# Patient Record
Sex: Male | Born: 1975 | Hispanic: Yes | Smoking: Never smoker
Health system: Southern US, Community
[De-identification: ages and names within clinical notes are randomized; demographics above are authoritative.]

## PROBLEM LIST (undated history)

## (undated) DIAGNOSIS — K648 Other hemorrhoids: Secondary | ICD-10-CM

## (undated) DIAGNOSIS — K5790 Diverticulosis of intestine, part unspecified, without perforation or abscess without bleeding: Secondary | ICD-10-CM

## (undated) DIAGNOSIS — K219 Gastro-esophageal reflux disease without esophagitis: Secondary | ICD-10-CM

## (undated) HISTORY — DX: Other hemorrhoids: K64.8

## (undated) HISTORY — DX: Diverticulosis of intestine, part unspecified, without perforation or abscess without bleeding: K57.90

## (undated) HISTORY — DX: Gastro-esophageal reflux disease without esophagitis: K21.9

## (undated) HISTORY — PX: WISDOM TOOTH EXTRACTION: SHX21

---

## 2007-01-08 ENCOUNTER — Emergency Department (HOSPITAL_COMMUNITY): Admission: EM | Admit: 2007-01-08 | Discharge: 2007-01-08 | Payer: Self-pay | Admitting: Emergency Medicine

## 2011-07-24 ENCOUNTER — Emergency Department (HOSPITAL_COMMUNITY)
Admission: EM | Admit: 2011-07-24 | Discharge: 2011-07-24 | Disposition: A | Discharge status: Home / Self Care | Payer: No Typology Code available for payment source | Attending: Emergency Medicine | Admitting: Emergency Medicine

## 2011-07-24 DIAGNOSIS — T1490XA Injury, unspecified, initial encounter: Secondary | ICD-10-CM | POA: Insufficient documentation

## 2011-07-24 DIAGNOSIS — Y92410 Unspecified street and highway as the place of occurrence of the external cause: Secondary | ICD-10-CM | POA: Insufficient documentation

## 2011-07-24 DIAGNOSIS — IMO0002 Reserved for concepts with insufficient information to code with codable children: Secondary | ICD-10-CM | POA: Insufficient documentation

## 2011-07-25 ENCOUNTER — Emergency Department (HOSPITAL_COMMUNITY)
Admission: EM | Admit: 2011-07-25 | Discharge: 2011-07-26 | Disposition: A | Discharge status: Home / Self Care | Payer: No Typology Code available for payment source | Attending: Emergency Medicine | Admitting: Emergency Medicine

## 2011-07-25 DIAGNOSIS — M542 Cervicalgia: Secondary | ICD-10-CM | POA: Insufficient documentation

## 2011-07-25 DIAGNOSIS — Y92410 Unspecified street and highway as the place of occurrence of the external cause: Secondary | ICD-10-CM | POA: Insufficient documentation

## 2011-07-25 DIAGNOSIS — S93409A Sprain of unspecified ligament of unspecified ankle, initial encounter: Secondary | ICD-10-CM | POA: Insufficient documentation

## 2011-07-25 DIAGNOSIS — R079 Chest pain, unspecified: Secondary | ICD-10-CM | POA: Insufficient documentation

## 2011-07-25 DIAGNOSIS — K5730 Diverticulosis of large intestine without perforation or abscess without bleeding: Secondary | ICD-10-CM | POA: Insufficient documentation

## 2011-07-25 DIAGNOSIS — K7689 Other specified diseases of liver: Secondary | ICD-10-CM | POA: Insufficient documentation

## 2011-07-25 DIAGNOSIS — T07XXXA Unspecified multiple injuries, initial encounter: Secondary | ICD-10-CM | POA: Insufficient documentation

## 2011-07-26 ENCOUNTER — Emergency Department (HOSPITAL_COMMUNITY): Payer: No Typology Code available for payment source

## 2011-07-26 LAB — DIFFERENTIAL
Eosinophils Relative: 2 % (ref 0–5)
Lymphocytes Relative: 36 % (ref 12–46)
Lymphs Abs: 3.2 10*3/uL (ref 0.7–4.0)
Monocytes Absolute: 0.6 10*3/uL (ref 0.1–1.0)
Monocytes Relative: 7 % (ref 3–12)

## 2011-07-26 LAB — POCT I-STAT, CHEM 8
BUN: 17 mg/dL (ref 6–23)
Chloride: 103 mEq/L (ref 96–112)
Creatinine, Ser: 1.1 mg/dL (ref 0.50–1.35)
Glucose, Bld: 94 mg/dL (ref 70–99)
Potassium: 4.3 mEq/L (ref 3.5–5.1)

## 2011-07-26 LAB — CBC
HCT: 45 % (ref 39.0–52.0)
MCHC: 34.2 g/dL (ref 30.0–36.0)
MCV: 83.6 fL (ref 78.0–100.0)
RDW: 13.3 % (ref 11.5–15.5)

## 2011-07-26 MED ORDER — IOHEXOL 300 MG/ML  SOLN
125.0000 mL | Freq: Once | INTRAMUSCULAR | Status: AC | PRN
  Administered 2011-07-26: 125 mL via INTRAVENOUS
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
## 2016-01-14 ENCOUNTER — Ambulatory Visit (INDEPENDENT_AMBULATORY_CARE_PROVIDER_SITE_OTHER): Payer: Medicaid Other | Admitting: Family Medicine

## 2016-01-14 ENCOUNTER — Encounter: Payer: Self-pay | Admitting: Family Medicine

## 2016-01-14 VITALS — BP 124/90 | HR 65 | Ht 72.0 in | Wt 224.0 lb

## 2016-01-14 DIAGNOSIS — R197 Diarrhea, unspecified: Secondary | ICD-10-CM | POA: Diagnosis not present

## 2016-01-14 DIAGNOSIS — R109 Unspecified abdominal pain: Secondary | ICD-10-CM

## 2016-01-14 DIAGNOSIS — Z23 Encounter for immunization: Secondary | ICD-10-CM | POA: Diagnosis not present

## 2016-01-14 LAB — CBC WITH DIFFERENTIAL/PLATELET
Basophils Absolute: 0 10*3/uL (ref 0.0–0.1)
Basophils Relative: 0 % (ref 0–1)
Eosinophils Absolute: 0.1 10*3/uL (ref 0.0–0.7)
Eosinophils Relative: 2 % (ref 0–5)
HEMATOCRIT: 48 % (ref 39.0–52.0)
HEMOGLOBIN: 16.7 g/dL (ref 13.0–17.0)
LYMPHS ABS: 2.6 10*3/uL (ref 0.7–4.0)
LYMPHS PCT: 36 % (ref 12–46)
MCH: 29.5 pg (ref 26.0–34.0)
MCHC: 34.8 g/dL (ref 30.0–36.0)
MCV: 84.8 fL (ref 78.0–100.0)
MONOS PCT: 7 % (ref 3–12)
MPV: 9.9 fL (ref 8.6–12.4)
Monocytes Absolute: 0.5 10*3/uL (ref 0.1–1.0)
NEUTROS ABS: 3.9 10*3/uL (ref 1.7–7.7)
NEUTROS PCT: 55 % (ref 43–77)
Platelets: 214 10*3/uL (ref 150–400)
RBC: 5.66 MIL/uL (ref 4.22–5.81)
RDW: 14 % (ref 11.5–15.5)
WBC: 7.1 10*3/uL (ref 4.0–10.5)

## 2016-01-14 LAB — COMPREHENSIVE METABOLIC PANEL
ALBUMIN: 4.4 g/dL (ref 3.6–5.1)
ALK PHOS: 49 U/L (ref 40–115)
ALT: 34 U/L (ref 9–46)
AST: 19 U/L (ref 10–40)
BUN: 18 mg/dL (ref 7–25)
CHLORIDE: 103 mmol/L (ref 98–110)
CO2: 29 mmol/L (ref 20–31)
Calcium: 9.3 mg/dL (ref 8.6–10.3)
Creat: 1.1 mg/dL (ref 0.60–1.35)
Glucose, Bld: 98 mg/dL (ref 65–99)
POTASSIUM: 4.5 mmol/L (ref 3.5–5.3)
Sodium: 140 mmol/L (ref 135–146)
TOTAL PROTEIN: 7 g/dL (ref 6.1–8.1)
Total Bilirubin: 0.8 mg/dL (ref 0.2–1.2)

## 2016-01-14 LAB — LIPID PANEL
CHOL/HDL RATIO: 6.9 ratio — AB (ref <=5.0)
CHOLESTEROL: 206 mg/dL — AB (ref 125–200)
HDL: 30 mg/dL — ABNORMAL LOW (ref >=40)
LDL Cholesterol: 115 mg/dL (ref <130)
TRIGLYCERIDES: 305 mg/dL — AB (ref <150)
VLDL: 61 mg/dL — AB (ref <30)

## 2016-01-14 LAB — TSH: TSH: 1.66 mIU/L (ref 0.40–4.50)

## 2016-01-14 NOTE — Progress Notes (Signed)
   Subjective:    Patient ID: Roberto Wolf, male    DOB: Jun 15, 1976, 40 y.o.   MRN: [id number]  HPI He is here after a several year absence to reestablish his care. He states that he has a 10 year history of difficulty with abdominal cramping followed by a BM. He does note stress does play a role in making his pain much worse. He has had no nausea, vomiting or bloody stools. No fever, chills. He does use Imodium 4 per day and Gas-X on a regular basis as well.he is on no other medications. He has not been evaluated for this in the past.   Review of Systems     Objective:   Physical Exam Alert and in no distress. Tympanic membranes and canals are normal. Pharyngeal area is normal. Neck is supple without adenopathy or thyromegaly. Cardiac exam shows a regular sinus rhythm without murmurs or gallops. Lungs are clear to auscultation.abdominal exam shows decreased bowel sounds without masses or tenderness.        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

## 2016-01-18 ENCOUNTER — Telehealth: Payer: Self-pay | Admitting: Family Medicine

## 2016-01-18 NOTE — Telephone Encounter (Signed)
Routed the referral to [facility] for appt

## 2016-01-18 NOTE — Telephone Encounter (Signed)
After Dr Elnoria HowardHung reviewed records on pt, he determined that he will not be able to see this pt

## 2016-01-28 ENCOUNTER — Encounter: Payer: Self-pay | Admitting: Internal Medicine

## 2016-02-12 ENCOUNTER — Encounter: Payer: Self-pay | Admitting: Internal Medicine

## 2016-02-12 ENCOUNTER — Other Ambulatory Visit (INDEPENDENT_AMBULATORY_CARE_PROVIDER_SITE_OTHER): Payer: Medicaid Other

## 2016-02-12 ENCOUNTER — Ambulatory Visit (INDEPENDENT_AMBULATORY_CARE_PROVIDER_SITE_OTHER): Payer: Medicaid Other | Admitting: Internal Medicine

## 2016-02-12 VITALS — BP 118/88 | HR 72 | Ht 72.0 in | Wt 223.0 lb

## 2016-02-12 DIAGNOSIS — K219 Gastro-esophageal reflux disease without esophagitis: Secondary | ICD-10-CM | POA: Diagnosis not present

## 2016-02-12 DIAGNOSIS — R197 Diarrhea, unspecified: Secondary | ICD-10-CM | POA: Diagnosis not present

## 2016-02-12 LAB — IGA: IgA: 315 mg/dL (ref 68–378)

## 2016-02-12 MED ORDER — NA SULFATE-K SULFATE-MG SULF 17.5-3.13-1.6 GM/177ML PO SOLN: 1.0000 | Freq: Once | ORAL | Status: DC

## 2016-02-12 NOTE — Progress Notes (Signed)
HISTORY OF PRESENT ILLNESS:  Roberto Wolf is a 40 y.o. male, self-employed DJ, who is referred for consultation by Dr. Sharlot GowdaJohn Lalonde regarding problems with abdominal cramping, diarrhea, and reflux. Outside records, laboratories, x-rays reviewed The patient reports a greater than 20 year history of abdominal complaints. Apparently underwent remote workup as a teenager. Told he had IBS. Recently seen by his PCP regarding these complaints. He underwent extensive blood work which was unremarkable except for abnormal lipids. This referral made. Patient describes abdominal cramping and gas followed by urgency and diarrhea. This occurs near daily. Can occur several times per day. Symptoms are exacerbated by stress and meals. No nocturnal component. There are rare days when he is asymptomatic. No weight loss. No bleeding. He self medicates with Imodium 4 daily and Gas-X 4 daily. He denies a family history of inflammatory bowel disease or colon cancer. Next, the patient reports problems with significant pyrosis and regurgitation at night. Occur several times per month. Will take an acid on demand if needed. Finally, he reports himself to be lactose intolerant. He denies taking supplements. Needs to be questioned on sugar-free items.  REVIEW OF SYSTEMS:  All non-GI ROS negative except for sinus and allergy, back pain  Past Medical History  Diagnosis Date  . GERD (gastroesophageal reflux disease)     Past Surgical History  Procedure Laterality Date  . Wisdom tooth extraction      Social History Roberto Wolf  reports that he has never smoked. He has never used smokeless tobacco. He reports that he does not drink alcohol or use illicit drugs.  family history includes Leukemia in his mother; Mental retardation in his brother.  No Known Allergies     PHYSICAL EXAMINATION: Vital signs: BP 118/88 mmHg  Pulse 72  Ht 6' (1.829 m)  Wt 223 lb (101.152 kg)  BMI 30.24 kg/m2  Constitutional:  Pleasant, generally well-appearing, no acute distress Psychiatric: alert and oriented x3, cooperative Eyes: extraocular movements intact, anicteric, conjunctiva pink Mouth: oral pharynx moist, no lesions Neck: supple without thyromegaly Lymph: no lymphadenopathy Cardiovascular: heart regular rate and rhythm, no murmur Lungs: clear to auscultation bilaterally Abdomen: soft, nontender, nondistended, no obvious ascites, no peritoneal signs, normal bowel sounds, no organomegaly Rectal: Deferred until colonoscopy Extremities: no clubbing cyanosis or lower extremity edema bilaterally Skin: no lesions on visible extremities Neuro: No focal deficits. Normal DTRs. No asterixis.  ASSESSMENT:  #1. Chronic problems with abdominal cramping/gas with urgency and loose stools. Suspect IBS. Rule out celiac. Rule out microscopic colitis #2. GERD. Severe times   PLAN:  #1. Obtain serologies for celiac disease #2. Schedule colonoscopy with biopsies to rule out microscopic colitis and assess for other causes for diarrhea and abdominal pain.The nature of the procedure, as well as the risks, benefits, and alternatives were carefully and thoroughly reviewed with the patient. Ample time for discussion and questions allowed. The patient understood, was satisfied, and agreed to proceed. #3. Schedule upper endoscopy to evaluate chronic GERD, abdominal pain, and diarrhea.The nature of the procedure, as well as the risks, benefits, and alternatives were carefully and thoroughly reviewed with the patient. Ample time for discussion and questions allowed. The patient understood, was satisfied, and agreed to proceed. #4. If the above workup negative then treatment for IBS #5. If the above workup negative then treatment for GERD  A copy of this consultation has been sent to Dr. Susann GivensLalonde

## 2016-02-12 NOTE — Patient Instructions (Signed)
You have been scheduled for an endoscopy and colonoscopy. Please follow the written instructions given to you at your visit today. Please pick up your prep supplies at the pharmacy within the next 1-3 days. If you use inhalers (even only as needed), please bring them with you on the day of your procedure. Your physician has requested that you go to www.startemmi.com and enter the access code given to you at your visit today. This web site gives a general overview about your procedure. However, you should still follow specific instructions given to you by our office regarding your preparation for the procedure.  Your physician has requested that you go to the basement for lab work before leaving today. 

## 2016-02-13 LAB — TISSUE TRANSGLUTAMINASE, IGA: Tissue Transglutaminase Ab, IgA: 1 U/mL (ref <4)

## 2016-03-10 ENCOUNTER — Telehealth: Payer: Self-pay | Admitting: Internal Medicine

## 2016-03-10 ENCOUNTER — Ambulatory Visit (INDEPENDENT_AMBULATORY_CARE_PROVIDER_SITE_OTHER): Payer: Medicaid Other | Admitting: Family Medicine

## 2016-03-10 ENCOUNTER — Encounter: Payer: Self-pay | Admitting: Family Medicine

## 2016-03-10 ENCOUNTER — Ambulatory Visit
Admission: RE | Admit: 2016-03-10 | Discharge: 2016-03-10 | Disposition: A | Discharge status: Home / Self Care | Payer: Medicaid Other | Source: Ambulatory Visit | Attending: Family Medicine | Admitting: Family Medicine

## 2016-03-10 VITALS — BP 122/84 | HR 80 | Ht 72.0 in | Wt 228.0 lb

## 2016-03-10 DIAGNOSIS — M542 Cervicalgia: Secondary | ICD-10-CM | POA: Diagnosis not present

## 2016-03-10 MED ORDER — PREDNISONE 10 MG (48) PO TBPK: ORAL_TABLET | Freq: Every day | ORAL | Status: DC

## 2016-03-10 NOTE — Progress Notes (Signed)
   Subjective:    Patient ID: Roberto Wolf, male    DOB: Feb 15, 1976, 40 y.o.   MRN: [id number]  HPI He complains of waking up 2 weeks ago and having right shoulder discomfort. He notes that his arm was in the abducted position when this occurred. He says at motion of the shoulder and neck cause increased pain. He also has tingling sensation in his thumb and fifth finger but no tingling. He has noted no weaknesses he has been using ibuprofen at maximum doses with no benefit.Motion of the neck does cause discomfort mainly in the neck and shoulder area but not into the same pattern as the shoulder pain.   Review of Systems     Objective:   Physical Exam Full motion of the neck with pain into the right neck area as well as shoulder. Normal motor, sensory. DTRs were diminished. Sensation is normal. Full motion of the shoulder without pain . Negative sulcus sign. Negative Neer's and Hawkins test.      Assessment & Plan:  Neck pain - Plan: DG Cervical Spine Complete, predniSONE (STERAPRED UNI-PAK 48 TAB) 10 MG (48) TBPK tablet The x-ray was essentially negative. I will call in a Sterapred Dosepak and if he continues to have difficulty, further evaluation will be needed.

## 2016-03-11 NOTE — Telephone Encounter (Signed)
Genella RifeGerd was not going to be treated until after egd for evaluation on 5/12.  Sent Dr. Marina GoodellPerry a message asking about gerd medication for patient.

## 2016-03-12 ENCOUNTER — Telehealth: Payer: Self-pay | Admitting: Family Medicine

## 2016-03-12 MED ORDER — OMEPRAZOLE 20 MG PO CPDR: 20.0000 mg | DELAYED_RELEASE_CAPSULE | Freq: Every day | ORAL | Status: DC

## 2016-03-12 NOTE — Telephone Encounter (Signed)
Patient wife calling back regarding this. Best # [phone]

## 2016-03-12 NOTE — Telephone Encounter (Signed)
Left message with patient's wife that per Dr. Marina GoodellPerry patient could take 20mg  OTC Prilosec or 20mg  of Omeprazole sent to the pharmacy.  I [facility] her [facility] and then they could compare the prices between the two and see which was more affordable.

## 2016-03-12 NOTE — Telephone Encounter (Signed)
Wife called & states pt is unable to tolerate the prednisone, states it is making him extremely nauseous.  She states he is still in a lot of pain. Wants to know if there is something else you can give him? Please advise

## 2016-03-12 NOTE — Telephone Encounter (Signed)
Have him come in so I can reevaluate and possibly get an MRI

## 2016-03-13 NOTE — Telephone Encounter (Signed)
Left pt message to call me back

## 2016-03-17 NOTE — Telephone Encounter (Signed)
Left another message today

## 2016-03-21 ENCOUNTER — Encounter: Payer: Self-pay | Admitting: Internal Medicine

## 2016-03-21 ENCOUNTER — Ambulatory Visit (AMBULATORY_SURGERY_CENTER): Payer: Medicaid Other | Admitting: Internal Medicine

## 2016-03-21 VITALS — BP 121/66 | HR 86 | Temp 98.9°F | Resp 20 | Ht 72.0 in | Wt 223.0 lb

## 2016-03-21 DIAGNOSIS — R197 Diarrhea, unspecified: Secondary | ICD-10-CM

## 2016-03-21 DIAGNOSIS — K219 Gastro-esophageal reflux disease without esophagitis: Secondary | ICD-10-CM | POA: Diagnosis not present

## 2016-03-21 DIAGNOSIS — K269 Duodenal ulcer, unspecified as acute or chronic, without hemorrhage or perforation: Secondary | ICD-10-CM | POA: Diagnosis not present

## 2016-03-21 LAB — HELICOBACTER PYLORI SCREEN-BIOPSY: UREASE: POSITIVE — AB

## 2016-03-21 MED ORDER — SODIUM CHLORIDE 0.9 % IV SOLN: 500.0000 mL | INTRAVENOUS | Status: DC

## 2016-03-21 NOTE — Patient Instructions (Signed)
YOU HAD AN ENDOSCOPIC PROCEDURE TODAY AT THE [facility]:   Refer to the procedure report that was given to you for any specific questions about what was found during the examination.  If the procedure report does not answer your questions, please call your gastroenterologist to clarify.  If you requested that your care partner not be given the details of your procedure findings, then the procedure report has been included in a sealed envelope for you to review at your convenience later.  YOU SHOULD EXPECT: Some feelings of bloating in the abdomen. Passage of more gas than usual.  Walking can help get rid of the air that was put into your GI tract during the procedure and reduce the bloating. If you had a lower endoscopy (such as a colonoscopy or flexible sigmoidoscopy) you may notice spotting of blood in your stool or on the toilet paper. If you underwent a bowel prep for your procedure, you may not have a normal bowel movement for a few days.  Please Note:  You might notice some irritation and congestion in your nose or some drainage.  This is from the oxygen used during your procedure.  There is no need for concern and it should clear up in a day or so.  SYMPTOMS TO REPORT IMMEDIATELY:   Following lower endoscopy (colonoscopy or flexible sigmoidoscopy):  Excessive amounts of blood in the stool  Significant tenderness or worsening of abdominal pains  Swelling of the abdomen that is new, acute  Fever of 100F or higher   Following upper endoscopy (EGD)  Vomiting of blood or coffee ground material  New chest pain or pain under the shoulder blades  Painful or persistently difficult swallowing  New shortness of breath  Fever of 100F or higher  Black, tarry-looking stools  For urgent or emergent issues, a gastroenterologist can be reached at any hour by calling [phone].   DIET: Your first meal following the procedure should be a small meal and then it is ok to progress to  your normal diet. Heavy or fried foods are harder to digest and may make you feel nauseous or bloated.  Likewise, meals heavy in dairy and vegetables can increase bloating.  Drink plenty of fluids but you should avoid alcoholic beverages for 24 hours.  ACTIVITY:  You should plan to take it easy for the rest of today and you should NOT DRIVE or use heavy machinery until tomorrow (because of the sedation medicines used during the test).    FOLLOW UP: Our staff will call the number listed on your records the next business day following your procedure to check on you and address any questions or concerns that you may have regarding the information given to you following your procedure. If we do not reach you, we will leave a message.  However, if you are feeling well and you are not experiencing any problems, there is no need to return our call.  We will assume that you have returned to your regular daily activities without incident.  If any biopsies were taken you will be contacted by phone or by letter within the next 1-3 weeks.  Please call us at [phone] if you have not heard about the biopsies in 3 weeks.    SIGNATURES/CONFIDENTIALITY: You and/or your care partner have signed paperwork which will be entered into your electronic medical record.  These signatures attest to the fact that that the information above on your After Visit Summary has been reviewed   and is understood.  Full responsibility of the confidentiality of this discharge information lies with you and/or your care-partner.   Take omeprazole daily,  Please make appointment for Dr. Marina GoodellPerry in 4 weeks to review results and assess response to treatments.  Fiber supplement daily 2 tablespoons daily.  Diverticulosis information given and hemorrhoid information given

## 2016-03-21 NOTE — Op Note (Signed)
[facility] Patient Name: Roberto Wolf Procedure Date: 03/21/2016 2:14 PM MRN: [id number] Endoscopist: Roberto Wolf. Roberto Wolf , MD Age: 40 Referring MD:  Date of Birth: [birth year] Gender: Male Procedure:                Colonoscopy, with biopsies Indications:              Chronic diarrhea Medicines:                Monitored Anesthesia Care Procedure:                Pre-Anesthesia Assessment:                           - Prior to the procedure, a History and Physical                            was performed, and patient medications and                            allergies were reviewed. The patient's tolerance of                            previous anesthesia was also reviewed. The risks                            and benefits of the procedure and the sedation                            options and risks were discussed with the patient.                            All questions were answered, and informed consent                            was obtained. Prior Anticoagulants: The patient has                            taken no previous anticoagulant or antiplatelet                            agents. ASA Grade Assessment: I - A normal, healthy                            patient. After reviewing the risks and benefits,                            the patient was deemed in satisfactory condition to                            undergo the procedure.                           After obtaining informed consent, the colonoscope  was passed under direct vision. Throughout the                            procedure, the patient's blood pressure, pulse, and                            oxygen saturations were monitored continuously. The                            Model CF-HQ190L [id number]) scope was introduced                            through the anus and advanced to the the cecum,                            identified by appendiceal orifice and ileocecal               valve. The terminal ileum, ileocecal valve,                            appendiceal orifice, and rectum were photographed.                            The quality of the bowel preparation was excellent.                            The colonoscopy was performed without difficulty.                            The patient tolerated the procedure well. The bowel                            preparation used was SUPREP. Scope In: 2:26:00 PM Scope Out: 2:37:04 PM Scope Withdrawal Time: 0 hours 8 minutes 19 seconds  Total Procedure Duration: 0 hours 11 minutes 4 seconds  Findings:                 The terminal ileum appeared normal.                           Multiple small and large-mouthed diverticula were                            found in the left colon.                           The exam was otherwise without abnormality on                            direct and retroflexion views.                           Biopsies for histology were taken with a cold  forceps from the entire colon for evaluation of                            microscopic colitis. Complications:            No immediate complications. Estimated blood loss:                            None. Estimated Blood Loss:     Estimated blood loss: none. Impression:               - The examined portion of the ileum was normal.                           - Diverticulosis in the left colon.                           - The examination was otherwise normal on direct                            and retroflexion views, except for small internal                            hemorrhoids.                           - Biopsies were taken with a cold forceps from the                            entire colon for evaluation of microscopic colitis. Recommendation:           - Repeat colonoscopy in 10 years for screening                            purposes.                           - Start Metamucil once 2 tablespoons daily  as may                            help your bowels.                           - Await pathology results.                           - EGD today. Please see results Roberto BonitoJohn N. Roberto GoodellPerry, MD 03/21/2016 2:50:45 PM This report has been signed electronically. CC Letter to:             Roberto NianJohn C. Lalonde, MD

## 2016-03-21 NOTE — Progress Notes (Signed)
Pt. Passing air after taking Levsin.  States he feels better.

## 2016-03-21 NOTE — Progress Notes (Signed)
Called to room to assist during endoscopic procedure.  Patient ID and intended procedure confirmed with present staff. Received instructions for my participation in the procedure from the performing physician.  

## 2016-03-21 NOTE — Op Note (Signed)
[facility] Patient Name: Roberto Wolf Procedure Date: 03/21/2016 2:13 PM MRN: [id number] Endoscopist: Wilhemina Bonito. [name] , MD Age: 40 Referring MD:  Date of Birth: [birth year] Gender: Male Procedure:                Upper GI endoscopy, with biopsies Indications:              Heartburn, chronic diarrhea Medicines:                Monitored Anesthesia Care Procedure:                Pre-Anesthesia Assessment:                           - Prior to the procedure, a History and Physical                            was performed, and patient medications and                            allergies were reviewed. The patient's tolerance of                            previous anesthesia was also reviewed. The risks                            and benefits of the procedure and the sedation                            options and risks were discussed with the patient.                            All questions were answered, and informed consent                            was obtained. Prior Anticoagulants: The patient has                            taken no previous anticoagulant or antiplatelet                            agents. ASA Grade Assessment: I - A normal, healthy                            patient. After reviewing the risks and benefits,                            the patient was deemed in satisfactory condition to                            undergo the procedure.                           After obtaining informed consent, the endoscope was  passed under direct vision. Throughout the                            procedure, the patient's blood pressure, pulse, and                            oxygen saturations were monitored continuously. The                            Model GIF-[id number]) scope was introduced                            through the mouth, and advanced to the second part                            of duodenum. The upper GI endoscopy was                          accomplished without difficulty. The patient                            tolerated the procedure well. Scope In: Scope Out: Findings:                 The examined esophagus was normal.                           The entire examined stomach was normal. Biopsies                            were taken with a cold forceps for Helicobacter                            pylori testing using CLOtest.                           A mild post-ulcer deformity was found in the                            duodenal bulb.                           One superficial duodenal ulcer was found in the                            duodenal bulb. The lesion was 4 mm in largest                            dimension.                           The exam of the duodenum was otherwise normal.                            Biopsies to rule out sprue obtained.  The cardia and gastric fundus were normal on                            retroflexion. Complications:            No immediate complications. Estimated Blood Loss:     Estimated blood loss: none. Impression:               - Normal esophagus.                           - Normal stomach. Biopsied.                           - Duodenal deformity.                           - One duodenal ulcer. Recommendation:           - Take omeprazole daily.                           - Await pathology results.                           - Please arrange office follow-up with Dr. Marina GoodellPerry in                            about 4 weeks to review results and assess response                            to treatments Wilhemina BonitoJohn N. Marina GoodellPerry, MD 03/21/2016 2:56:56 PM This report has been signed electronically. CC Letter to:             Ronnald NianJohn C. Lalonde, MD

## 2016-03-21 NOTE — Progress Notes (Signed)
A and Ox 3 Report to RN 

## 2016-03-24 ENCOUNTER — Telehealth: Payer: Self-pay | Admitting: *Deleted

## 2016-03-24 ENCOUNTER — Other Ambulatory Visit: Payer: Self-pay

## 2016-03-24 MED ORDER — OMEPRAZOLE 20 MG PO CPDR: 20.0000 mg | DELAYED_RELEASE_CAPSULE | Freq: Two times a day (BID) | ORAL | Status: DC

## 2016-03-24 MED ORDER — BIS SUBCIT-METRONID-TETRACYC 140-125-125 MG PO CAPS: 3.0000 | ORAL_CAPSULE | Freq: Three times a day (TID) | ORAL | Status: DC

## 2016-03-24 NOTE — Telephone Encounter (Signed)
Name identifier, left message, follow-up 

## 2016-03-25 ENCOUNTER — Telehealth: Payer: Self-pay | Admitting: Internal Medicine

## 2016-03-25 ENCOUNTER — Telehealth: Payer: Self-pay

## 2016-03-25 NOTE — Telephone Encounter (Signed)
Pt aware, see result note. 

## 2016-03-25 NOTE — Telephone Encounter (Signed)
-----   Message from Hilarie FredricksonJohn N [name], MD sent at 03/24/2016  8:48 AM EDT ----- Bonita QuinLinda, See below message. Probably did not need treated with Cipro and Flagyl given the procedures were with simple biopsies only. In any event, I did send you a note regarding positive Helicobacter pylori in this patient. He can stop Cipro Flagyl and complete H. pylori treatment as I recommended. Thanks ----- Message -----    From: Jeani HawkingPatrick Hung, MD    Sent: 03/22/2016   7:49 PM      To: Hilarie FredricksonJohn N [name], MD  John,   The patient called with complaints of a temp at 100.8 post EGD/Colonoscopy.  No abdominal pain, but he felt tired.  I opted to treat him with Cipro/Flagyl x 10 days.

## 2016-03-25 NOTE — Telephone Encounter (Signed)
Left message for pt to call back  °

## 2016-03-27 NOTE — Telephone Encounter (Signed)
Left message to call back  

## 2016-03-28 ENCOUNTER — Encounter: Payer: Self-pay | Admitting: Internal Medicine

## 2016-03-31 NOTE — Telephone Encounter (Signed)
Left message to call back. Have been unable to reach pt, letter mailed.

## 2016-05-01 ENCOUNTER — Ambulatory Visit: Payer: Medicaid Other | Admitting: Internal Medicine

## 2018-06-03 ENCOUNTER — Encounter (INDEPENDENT_AMBULATORY_CARE_PROVIDER_SITE_OTHER): Payer: Self-pay

## 2018-06-03 ENCOUNTER — Ambulatory Visit: Payer: Self-pay | Admitting: Family Medicine

## 2018-06-30 ENCOUNTER — Ambulatory Visit: Payer: Medicaid Other | Admitting: Family Medicine

## 2019-06-10 ENCOUNTER — Ambulatory Visit: Payer: Medicaid Other | Admitting: Family Medicine

## 2019-10-15 ENCOUNTER — Encounter: Payer: Self-pay | Admitting: Emergency Medicine

## 2019-10-15 ENCOUNTER — Emergency Department: Payer: Medicaid Other

## 2019-10-15 ENCOUNTER — Emergency Department
Admission: EM | Admit: 2019-10-15 | Discharge: 2019-10-15 | Disposition: A | Discharge status: Home / Self Care | Payer: Medicaid Other | Attending: Emergency Medicine | Admitting: Emergency Medicine

## 2019-10-15 ENCOUNTER — Ambulatory Visit
Admission: EM | Admit: 2019-10-15 | Discharge: 2019-10-15 | Disposition: A | Discharge status: Home / Self Care | Payer: Medicaid Other | Attending: Emergency Medicine | Admitting: Emergency Medicine

## 2019-10-15 ENCOUNTER — Other Ambulatory Visit: Payer: Self-pay

## 2019-10-15 DIAGNOSIS — J189 Pneumonia, unspecified organism: Secondary | ICD-10-CM | POA: Diagnosis not present

## 2019-10-15 DIAGNOSIS — R509 Fever, unspecified: Secondary | ICD-10-CM | POA: Diagnosis present

## 2019-10-15 DIAGNOSIS — R55 Syncope and collapse: Secondary | ICD-10-CM | POA: Diagnosis present

## 2019-10-15 DIAGNOSIS — Z20828 Contact with and (suspected) exposure to other viral communicable diseases: Secondary | ICD-10-CM | POA: Diagnosis not present

## 2019-10-15 DIAGNOSIS — R0600 Dyspnea, unspecified: Secondary | ICD-10-CM

## 2019-10-15 LAB — CBC
HCT: 46.9 % (ref 39.0–52.0)
Hemoglobin: 15.9 g/dL (ref 13.0–17.0)
MCH: 28 pg (ref 26.0–34.0)
MCHC: 33.9 g/dL (ref 30.0–36.0)
MCV: 82.6 fL (ref 80.0–100.0)
Platelets: 250 10*3/uL (ref 150–400)
RBC: 5.68 MIL/uL (ref 4.22–5.81)
RDW: 13.1 % (ref 11.5–15.5)
WBC: 6.4 10*3/uL (ref 4.0–10.5)
nRBC: 0 % (ref 0.0–0.2)

## 2019-10-15 LAB — URINALYSIS, COMPLETE (UACMP) WITH MICROSCOPIC
Bacteria, UA: NONE SEEN
Bilirubin Urine: NEGATIVE
Glucose, UA: NEGATIVE mg/dL
Hgb urine dipstick: NEGATIVE
Ketones, ur: NEGATIVE mg/dL
Leukocytes,Ua: NEGATIVE
Nitrite: NEGATIVE
Protein, ur: 30 mg/dL — AB
Specific Gravity, Urine: 1.03 (ref 1.005–1.030)
Squamous Epithelial / HPF: NONE SEEN (ref 0–5)
pH: 5 (ref 5.0–8.0)

## 2019-10-15 LAB — RAPID INFLUENZA A&B ANTIGENS
Influenza A (ARMC): NEGATIVE
Influenza B (ARMC): NEGATIVE

## 2019-10-15 LAB — BASIC METABOLIC PANEL
Anion gap: 9 (ref 5–15)
BUN: 14 mg/dL (ref 6–20)
CO2: 23 mmol/L (ref 22–32)
Calcium: 9.3 mg/dL (ref 8.9–10.3)
Chloride: 105 mmol/L (ref 98–111)
Creatinine, Ser: 1.29 mg/dL — ABNORMAL HIGH (ref 0.61–1.24)
GFR calc Af Amer: >60 mL/min (ref >60)
GFR calc non Af Amer: >60 mL/min (ref >60)
Glucose, Bld: 109 mg/dL — ABNORMAL HIGH (ref 70–99)
Potassium: 3.6 mmol/L (ref 3.5–5.1)
Sodium: 137 mmol/L (ref 135–145)

## 2019-10-15 LAB — SARS CORONAVIRUS 2 AG (30 MIN TAT): SARS Coronavirus 2 Ag: NEGATIVE

## 2019-10-15 LAB — RAPID STREP SCREEN (MED CTR MEBANE ONLY): Streptococcus, Group A Screen (Direct): NEGATIVE

## 2019-10-15 MED ORDER — PREDNISONE 50 MG PO TABS: ORAL_TABLET | ORAL | 0 refills | Status: DC

## 2019-10-15 MED ORDER — CEFTRIAXONE SODIUM 1 G IJ SOLR
1.0000 g | Freq: Once | INTRAMUSCULAR | Status: AC
  Administered 2019-10-15: 1 g via INTRAMUSCULAR
  Filled 2019-10-15: qty 10

## 2019-10-15 MED ORDER — ACETAMINOPHEN 500 MG PO TABS
1000.0000 mg | ORAL_TABLET | Freq: Once | ORAL | Status: AC
  Administered 2019-10-15: 1000 mg via ORAL

## 2019-10-15 MED ORDER — AZITHROMYCIN 250 MG PO TABS: ORAL_TABLET | ORAL | 0 refills | Status: AC

## 2019-10-15 NOTE — ED Provider Notes (Signed)
HPI  SUBJECTIVE:  Roberto Wolf is a 43 y.o. male who presents with cough and sore throat starting 3 weeks ago.  2 weeks ago he started having fevers T-max 104.  He reports body aches, headaches, nasal congestion, altered taste.  He reports shortness of breath and a cough productive of occasional phlegm starting several days ago.  He reports upper abdominal cramping every time he tries to eat.  States that he has been subsisting on liquids only.  He states that he is hungry and thirsty but is afraid to eat because it causes abdominal pain followed by diarrhea.  He reports having multiple episodes of watery diarrhea a day, 4 episodes today.  He reports nausea, but no vomiting.  No urinary complaints.  He also reports having intermittent, minutes long central chest pain described as pressure and heaviness accompanied with shortness of breath.  There does not seem to be an exertional component to this.  He reports occasional nausea and diaphoresis with the chest pain or shortness of breath.  He tried sitting up, leaning forward.  There are no real aggravating or alleviating factors.  He has never had chest pain like this before.  No known exposure to Covid.  He has been taking DayQuil, NyQuil, TheraFlu, Sudafed, Imodium with temporary symptomatic relief.  His abdominal pain and diarrhea is worse with eating.  He reports having an episode of syncope last night while in the shower.  He states that he had warning that he was about the past that was able to sit down before he fully lost consciousness.  He is not sure if he completely lost consciousness or not.  He reports having chest pain shortness of breath but no nausea, diaphoresis, tunnel vision, tinnitus.  He denies head injury.  This has never happened to him before.  He states that he has been mostly in bed for the past 3 weeks, he reports an episode of burning right lower extremity pain 3 or 4 days ago.  States it was located over his entire right  lower extremity.  No isolated calf pain, swelling, hemoptysis.  Past medical history of hypertension, hypercholesterolemia.  No history of PE, DVT, MI, coronary disease, smoking, pulmonary disease, chronic kidney disease, cancer, HIV, immunocompromise, abdominal surgery, syncope, gallbladder disease, atrial fibrillation, arrhythmia.  PMD: Not sure.  Past Medical History:  Diagnosis Date  . Diverticulosis   . GERD (gastroesophageal reflux disease)   . Internal hemorrhoids     Past Surgical History:  Procedure Laterality Date  . WISDOM TOOTH EXTRACTION      Family History  Problem Relation Age of Onset  . Leukemia Mother   . Mental retardation Brother   . Colon cancer Neg Hx     Social History   Tobacco Use  . Smoking status: Never Smoker  . Smokeless tobacco: Never Used  Substance Use Topics  . Alcohol use: No    Alcohol/week: 0.0 standard drinks  . Drug use: No    No current facility-administered medications for this encounter.   Current Outpatient Medications:  .  Calcium Carbonate Antacid (ANTACID EXTRA STRENGTH PO), Take by mouth. Reported on 03/10/2016, Disp: , Rfl:  .  omeprazole (PRILOSEC) 20 MG capsule, Take 1 capsule (20 mg total) by mouth 2 (two) times daily before a meal., Disp: 28 capsule, Rfl: 0  No Known Allergies   ROS  As noted in HPI.   Physical Exam  BP 123/87 (BP Location: Left Arm)   Pulse 89   Temp Marland Kitchen)  102.8 F (39.3 C) (Oral)   Resp 16   Ht 6' (1.829 m)   Wt 81.6 kg   SpO2 95%   BMI 24.41 kg/m   Constitutional: Well developed, well nourished, no acute distress Eyes: PERRL, EOMI, conjunctiva normal bilaterally HENT: Normocephalic, atraumatic,mucus membranes moist Respiratory: Clear to auscultation bilaterally, no rales, no wheezing, no rhonchi Cardiovascular: Normal rate and rhythm, no murmurs, no gallops, no rubs GI: Soft, nondistended, normal bowel sounds, nontender, no rebound, no guarding Back: no CVAT skin: No rash, skin  intact Musculoskeletal: No edema, no tenderness, no deformities, calves nontender, no palpable cord.  No edema. Neurologic: Alert & oriented x 3, CN III-XII grossly intact, no motor deficits, sensation grossly intact Psychiatric: Speech and behavior appropriate   ED Course   Medications  acetaminophen (TYLENOL) tablet 1,000 mg (1,000 mg Oral Given by Other 10/15/19 1340)    Orders Placed This Encounter  Procedures  . Rapid Influenza A&B Antigens ([facility] only)    Standing Status:   Standing    Number of Occurrences:   1  . SARS Coronavirus 2 Ag (30 min TAT) - Nasal Swab (BD Veritor Kit)    Standing Status:   Standing    Number of Occurrences:   1    Order Specific Question:   Is this test for diagnosis or screening    Answer:   Diagnosis of ill patient    Order Specific Question:   Symptomatic for COVID-19 as defined by CDC    Answer:   Yes    Order Specific Question:   Date of Symptom Onset    Answer:   10/08/2019    Order Specific Question:   Hospitalized for COVID-19    Answer:   Yes    Order Specific Question:   Admitted to ICU for COVID-19    Answer:   No    Order Specific Question:   Previously tested for COVID-19    Answer:   No    Order Specific Question:   Resident in a congregate (group) care setting    Answer:   No    Order Specific Question:   Employed in healthcare setting    Answer:   No  . Rapid Strep Screen ([facility] ONLY)    Standing Status:   Standing    Number of Occurrences:   1  . Culture, group A strep    Standing Status:   Standing    Number of Occurrences:   1  . Airborne and Contact precautions    Standing Status:   Standing    Number of Occurrences:   1  . ED EKG    Standing Status:   Standing    Number of Occurrences:   1    Order Specific Question:   Reason for Exam    Answer:   Syncope  . ED EKG    Standing Status:   Standing    Number of Occurrences:   1    Order Specific Question:   Reason for Exam    Answer:   Syncope  . EKG  12-Lead    Standing Status:   Standing    Number of Occurrences:   1   Results for orders placed or performed during the hospital encounter of 10/15/19 (from the past 24 hour(s))  Rapid Influenza A&B Antigens ([facility] only)     Status: None   Collection Time: 10/15/19  1:09 PM   Specimen: Flu Kit Nasopharyngeal Swab; Respiratory  Result Value Ref Range   Influenza A ([facility]) NEGATIVE NEGATIVE   Influenza B ([facility]) NEGATIVE NEGATIVE  SARS Coronavirus 2 Ag (30 min TAT) - Nasal Swab (BD Veritor Kit)     Status: None   Collection Time: 10/15/19  1:09 PM   Specimen: Nasal Swab (BD Veritor Kit)  Result Value Ref Range   SARS Coronavirus 2 Ag NEGATIVE NEGATIVE  Rapid Strep Screen ([facility] ONLY)     Status: None   Collection Time: 10/15/19  1:09 PM   Specimen: Throat; Other  Result Value Ref Range   Streptococcus, Group A Screen (Direct) NEGATIVE NEGATIVE   No results found.  ED Clinical Impression  1. Fever, unspecified fever cause   2. Syncope, unspecified syncope type      ED Assessment/Plan  Rapid flu, Covid, strep negative. EKG: Normal sinus rhythm, rate 82.  Normal axis, normal intervals.  No hypertrophy.  No ST-T wave changes concerning for ischemia.  No previous EKG for comparison.  Patient had a fever of over 102 while here, he was given a gram of Tylenol.  While he may have Covid, I am concerned about the prolonged fever.  I suspect that he may have secondary bacterial infection.  Concern for chest pains complains of shortness of breath that he has been having during this time and with the episode of syncope last night, feel that transfer to the ED is warranted.  He could simply be dehydrated causing the syncope last night, however, cannot rule out cardiac cause.  He is not tachycardic or hypoxic.  PE lower on the differential.  Transferring to the ED for IV fluids, comprehensive work-up.  Discussed rationale for transfer to the [facility] emergency department.  Feel that he is  stable to go by private vehicle.  Patient agrees to go.   Meds ordered this encounter  Medications  . acetaminophen (TYLENOL) tablet 1,000 mg    *This clinic note was created using Lobbyist. Therefore, there may be occasional mistakes despite careful proofreading.  ?    Melynda Ripple, MD 10/15/19 [phone]

## 2019-10-15 NOTE — ED Notes (Signed)
No reaction to IM injection noted.

## 2019-10-15 NOTE — ED Triage Notes (Signed)
Patient c/o cough, congestion, bodyaches, and low grade fever that started 2-3 weeks ago.  Patient also reports fatigue and nausea.

## 2019-10-15 NOTE — ED Notes (Signed)
Pt presents to the ED after having flu-like symptoms for about 3 weeks. Pt states that he also passed out in the shower last night. Pt c/o of body aches, fatigue, decreased PO intake, and weakness. Pt is A&Ox4 on assessment and has no neurological deficits at this time.

## 2019-10-15 NOTE — ED Triage Notes (Addendum)
Pt arrived via POV from [facility] with reports of flu-like sxs x 3 weeks, pt reports cough, body aches, fatigue, decreased appetite, weakness, abdominal cramping and fever.  Pt reports he gets abdominal cramping after eating and has recent poor PO intake.  PT reports passing out in the shower last night as well and felt short of breath afterwards.   Pt ambulatory without difficulty in triage, respirations equal and unlabored, pt speaking in complete sentences without difficulty.  Pt had fever of 102.8 at [facility] and received tylenol, temp now 100.2.  Pt tested for the flu and covid at [facility] both were negative per pt's paperwork.

## 2019-10-15 NOTE — ED Provider Notes (Signed)
Emergency Department Provider Note  ____________________________________________  Time seen: Approximately 3:59 PM  I have reviewed the triage vital signs and the nursing notes.   HISTORY  Chief Complaint Loss of Consciousness and Fever   Historian Patient     HPI Roberto Wolf is a 43 y.o. male with a history of diverticulosis and GERD, presents to the emergency department with reports of flulike symptoms for approximately 3 weeks.  Patient reports that he has had fever on and off for approximately 2 weeks with his highest fever being 104 F assess orally.  He endorses headache, nasal congestion and changes in taste.  He has had some shortness of breath with cough and some sputum production.  He reports that he only has shortness of breath with cough.  No chest pain or chest tightness.  No abdominal pain.  He denies dysuria, hematuria or increased urinary frequency.  He has been taking DayQuil, NyQuil and TheraFlu.  He has had diarrhea.  He has no known contacts with COVID-19.  No other sick contacts in the home.  Patient is concerned as he was in the shower last night and states that he "blacked out" momentarily.   Past Medical History:  Diagnosis Date  . Diverticulosis   . GERD (gastroesophageal reflux disease)   . Internal hemorrhoids      Immunizations up to date:  Yes.     Past Medical History:  Diagnosis Date  . Diverticulosis   . GERD (gastroesophageal reflux disease)   . Internal hemorrhoids     There are no active problems to display for this patient.   Past Surgical History:  Procedure Laterality Date  . WISDOM TOOTH EXTRACTION      Prior to Admission medications   Medication Sig Start Date End Date Taking? Authorizing Provider  Calcium Carbonate Antacid (ANTACID EXTRA STRENGTH PO) Take by mouth. Reported on 03/10/2016    [provider]  omeprazole (PRILOSEC) 20 MG capsule Take 1 capsule (20 mg total) by mouth 2 (two) times daily before a  meal. 03/24/16   Hilarie Fredrickson, MD  bismuth-metronidazole-tetracycline [facility]) [phone] MG capsule Take 3 capsules by mouth 4 (four) times daily -  before meals and at bedtime. 03/24/16 10/15/19  Hilarie Fredrickson, MD    Allergies Patient has no known allergies.  Family History  Problem Relation Age of Onset  . Leukemia Mother   . Mental retardation Brother   . Colon cancer Neg Hx     Social History Social History   Tobacco Use  . Smoking status: Never Smoker  . Smokeless tobacco: Never Used  Substance Use Topics  . Alcohol use: No    Alcohol/week: 0.0 standard drinks  . Drug use: No      Review of Systems  Constitutional: Patient has fever.  Eyes: No visual changes. No discharge ENT: Patient has congestion.  Cardiovascular: no chest pain. Respiratory: Patient has cough.  Gastrointestinal: No abdominal pain.  No nausea, no vomiting. Patient had diarrhea.  Genitourinary: Negative for dysuria. No hematuria Musculoskeletal: Patient has myalgias.  Skin: Negative for rash, abrasions, lacerations, ecchymosis. Neurological: Patient has headache, no focal weakness or numbness.   ____________________________________________   PHYSICAL EXAM:  VITAL SIGNS: ED Triage Vitals  Enc Vitals Group     BP 10/15/19 1438 (!) 155/94     Pulse Rate 10/15/19 1438 78     Resp 10/15/19 1438 18     Temp 10/15/19 1438 100.2 F (37.9 C)     Temp Source  10/15/19 1438 Oral     SpO2 10/15/19 1438 95 %     Weight 10/15/19 1435 180 lb (81.6 kg)     Height 10/15/19 1435 6' (1.829 m)     Head Circumference --      Peak Flow --      Pain Score 10/15/19 1434 4     Pain Loc --      Pain Edu? --      Excl. in [place]? --      Constitutional: Alert and oriented. Patient is lying supine. Eyes: Conjunctivae are normal. PERRL. EOMI. Head: Atraumatic. ENT:      Ears: Tympanic membranes are mildly injected with mild effusion bilaterally.       Nose: No congestion/rhinnorhea.      Mouth/Throat:  Mucous membranes are moist. Posterior pharynx is mildly erythematous.  Hematological/Lymphatic/Immunilogical: No cervical lymphadenopathy.  Cardiovascular: Normal rate, regular rhythm. Normal S1 and S2.  Good peripheral circulation. Respiratory: Normal respiratory effort without tachypnea or retractions. Lungs CTAB. Good air entry to the bases with no decreased or absent breath sounds. Gastrointestinal: Bowel sounds 4 quadrants. Soft and nontender to palpation. No guarding or rigidity. No palpable masses. No distention. No CVA tenderness. Musculoskeletal: Full range of motion to all extremities. No gross deformities appreciated. Neurologic:  Normal speech and language. No gross focal neurologic deficits are appreciated.  Skin:  Skin is warm, dry and intact. No rash noted. Psychiatric: Mood and affect are normal. Speech and behavior are normal. Patient exhibits appropriate insight and judgement.    ____________________________________________   LABS (all labs ordered are listed, but only abnormal results are displayed)  Labs Reviewed  BASIC METABOLIC PANEL - Abnormal; Notable for the following components:      Result Value   Glucose, Bld 109 (*)    Creatinine, Ser 1.29 (*)    All other components within normal limits  URINALYSIS, COMPLETE (UACMP) WITH MICROSCOPIC - Abnormal; Notable for the following components:   Color, Urine AMBER (*)    APPearance CLEAR (*)    Protein, ur 30 (*)    All other components within normal limits  CBC   ____________________________________________  EKG   ____________________________________________  RADIOLOGY Unk Pinto, personally viewed and evaluated these images (plain radiographs) as part of my medical decision making, as well as reviewing the written report by the radiologist.    Dg Chest 2 View  Result Date: 10/15/2019 CLINICAL DATA:  Cough and body aches EXAM: CHEST - 2 VIEW COMPARISON:  CT chest dated 07/26/2011. FINDINGS: The  heart size and mediastinal contours are within normal limits. There is suggestion of lower lung airspace opacities on the lateral view. There is no pleural effusion or pneumothorax. The visualized skeletal structures are unremarkable. IMPRESSION: Suggestion of lower lung airspace opacities on the lateral view may represent pneumonia. Electronically Signed   By: Zerita Boers M.D.   On: 10/15/2019 16:01    ____________________________________________    PROCEDURES  Procedure(s) performed:     Procedures     Medications - No data to display   ____________________________________________   INITIAL IMPRESSION / ASSESSMENT AND PLAN / ED COURSE  Pertinent labs & imaging results that were available during my care of the patient were reviewed by me and considered in my medical decision making (see chart for details).      Assessment and Plan: Post Viral Pneumonia  43 year old male presents to the emergency department with flulike symptoms for the past 3 weeks.  He reports that  his symptoms improved last week and for the course of 2 to 3 days and he felt like he acutely worsened again.  Patient was hypertensive and had low-grade fever at triage but vital signs were otherwise reassuring.  He was satting at 95% on room air.  On physical exam, patient was sitting upright and was able to speak in complete sentences with no perceived increased work of breathing.  There is no accessory muscle use for respiration and I did not auscultate any wheezing or crackles on exam.  Chest x-ray revealed lower lung space opacities.  Will treat with Rocephin and azithromycin and prednisone at discharge to address likely post viral pneumonia.  Patient was advised to increase hydration at home as his creatinine is mildly elevated from last lab work.  He voiced understanding.  Return precautions were given to return to the emergency department with new or worsening symptoms.  All patient questions were  answered.  ____________________________________________  FINAL CLINICAL IMPRESSION(S) / ED DIAGNOSES  Final diagnoses:  Dyspnea      NEW MEDICATIONS STARTED DURING THIS VISIT:  ED Discharge Orders    None          This chart was dictated using voice recognition software/Dragon. Despite best efforts to proofread, errors can occur which can change the meaning. Any change was purely unintentional.     Orvil FeilWoods, Jaclyn M, PA-C 10/15/19 1759    Sharyn CreamerQuale, Mark, MD 10/16/19 [phone]

## 2019-10-15 NOTE — Discharge Instructions (Signed)
Your rapid strep, Covid and influenza were negative here.  I am concerned that you may have a secondary infection causing your symptoms.  Your chest pain, shortness of breath and syncope are also concerning.

## 2019-10-17 ENCOUNTER — Telehealth: Payer: Self-pay

## 2019-10-17 NOTE — Telephone Encounter (Signed)
Called pt to find ut how he was doing. No answer and LVM. [place]

## 2019-10-18 LAB — CULTURE, GROUP A STREP (THRC)

## 2019-10-19 ENCOUNTER — Encounter: Payer: Self-pay | Admitting: Family Medicine

## 2021-12-16 ENCOUNTER — Telehealth: Payer: Self-pay

## 2021-12-16 NOTE — Telephone Encounter (Signed)
Pt was called to find out  if he is still a pt. And if he has had his flu shot. KH

## 2022-07-16 ENCOUNTER — Encounter: Payer: Self-pay | Admitting: Internal Medicine

## 2022-09-01 ENCOUNTER — Encounter: Payer: Self-pay | Admitting: Internal Medicine

## 2024-01-05 ENCOUNTER — Encounter: Payer: Self-pay | Admitting: Internal Medicine

## 2024-06-07 ENCOUNTER — Telehealth: Admitting: Physician Assistant

## 2024-06-07 DIAGNOSIS — K6289 Other specified diseases of anus and rectum: Secondary | ICD-10-CM

## 2024-06-07 DIAGNOSIS — R194 Change in bowel habit: Secondary | ICD-10-CM

## 2024-06-07 DIAGNOSIS — K625 Hemorrhage of anus and rectum: Secondary | ICD-10-CM

## 2024-06-07 DIAGNOSIS — R5382 Chronic fatigue, unspecified: Secondary | ICD-10-CM

## 2024-06-07 NOTE — Progress Notes (Signed)
 Virtual Visit Consent   Roberto Wolf, you are scheduled for a virtual visit with a [facility] provider today. Just as with appointments in the office, your consent must be obtained to participate. Your consent will be active for this visit and any virtual visit you may have with one of our providers in the next 365 days. If you have a MyChart account, a copy of this consent can be sent to you electronically.  As this is a virtual visit, video technology does not allow for your provider to perform a traditional examination. This may limit your provider's ability to fully assess your condition. If your provider identifies any concerns that need to be evaluated in person or the need to arrange testing (such as labs, EKG, etc.), we will make arrangements to do so. Although advances in technology are sophisticated, we cannot ensure that it will always work on either your end or our end. If the connection with a video visit is poor, the visit may have to be switched to a telephone visit. With either a video or telephone visit, we are not always able to ensure that we have a secure connection.  By engaging in this virtual visit, you consent to the provision of healthcare and authorize for your insurance to be billed (if applicable) for the services provided during this visit. Depending on your insurance coverage, you may receive a charge related to this service.  I need to obtain your verbal consent now. Are you willing to proceed with your visit today? Roberto Wolf has provided verbal consent on 06/07/2024 for a virtual visit (video or telephone). Roberto Wolf, [place]  Date: 06/07/2024 1:19 PM   Virtual Visit via Video Note   I, Roberto Wolf, connected with  Roberto Wolf  ([id number], [birth year]) on 06/07/24 at 12:45 PM EDT by a video-enabled telemedicine application and verified that I am speaking with the correct person using two identifiers.  Location: Patient: Virtual Visit  Location Patient: Home Provider: Virtual Visit Location Provider: Home Office   I discussed the limitations of evaluation and management by telemedicine and the availability of in person appointments. The patient expressed understanding and agreed to proceed.    History of Present Illness: Roberto Wolf is a 48 y.o. who identifies as a male who was assigned male at birth, and is being seen today for some ongoing symptoms he has concerns about. Notes for the past 3-4 months he has been having chronic fatigue, falling asleep soon after sitting during the day. This is associated with some pain and discomfort that he describes as a lower abdominal/pelvic pressure with occasional  shooting pain into the groin. Also noting an occasional sensation of a squeezing pain in the rectum.  2-3 weeks ago last episode. Occasional blood in the stool. Has noted painful intercourse so has been refraining from activity. Painful climax. No blood in semen.   Hs of duodenal ulcer in 2017 diagnosed on EGD. H pylori positive which was treated. Patient denies heartburn or indigestion, but tries to watch what he eats. No NSAID use, no tobacco/smoking/alcohol use.  Colonoscopy in 2017 revealed diverticuli. Biopsies reviewed and negative for precancerous/cancerous findings.   Mother diagnosed with leukemia so being cautious.   HPI: HPI  Problems: There are no active problems to display for this patient.   Allergies: No Known Allergies Medications: No current outpatient medications on file.  Observations/Objective: Patient is well-developed, well-nourished in no acute distress.  Resting comfortably at home.  Head is normocephalic, atraumatic.  No labored breathing. Speech is clear and coherent with logical content.  Patient is alert and oriented at baseline.   Assessment and Plan: 1. Chronic fatigue (Primary)  2. Bowel habit changes  3. Rectal pain  4. Rectal bleeding  Needs further evaluation of ongoing  symptoms. He is to call PCP office ASAP to schedule in person evaluation. Has been > 5 years since last GI assessment so would need referral to specialist. Will send message to PCP for review but patient is calling them after visit to set up appt.   Follow Up Instructions: I discussed the assessment and treatment plan with the patient. The patient was provided an opportunity to ask questions and all were answered. The patient agreed with the plan and demonstrated an understanding of the instructions.  A copy of instructions were sent to the patient via MyChart unless otherwise noted below.   The patient was advised to call back or seek an in-person evaluation if the symptoms worsen or if the condition fails to improve as anticipated.    Roberto Velma Lunger, PA-C

## 2024-06-07 NOTE — Patient Instructions (Signed)
  Jahmez Kreeger, thank you for joining Elsie Velma Lunger, PA-C for today's virtual visit.  While this provider is not your primary care provider (PCP), if your PCP is located in our provider database this encounter information will be shared with them immediately following your visit.   A [facility] MyChart account gives you access to today's visit and all your visits, tests, and labs performed at [facility]  click here if you don't have a [facility] MyChart account or go to [email or web address]  Consent: (Patient) Roberto Wolf provided verbal consent for this virtual visit at the beginning of the encounter.  Current Medications:  Current Outpatient Medications:    Calcium Carbonate Antacid (ANTACID EXTRA STRENGTH PO), Take by mouth. Reported on 03/10/2016, Disp: , Rfl:    omeprazole  (PRILOSEC) 20 MG capsule, Take 1 capsule (20 mg total) by mouth 2 (two) times daily before a meal., Disp: 28 capsule, Rfl: 0   predniSONE  (DELTASONE ) 50 MG tablet, Take one tablet by mouth daily for the next five days., Disp: 5 tablet, Rfl: 0   Medications ordered in this encounter:  No orders of the defined types were placed in this encounter.    *If you need refills on other medications prior to your next appointment, please contact your pharmacy*  Follow-Up: Call back or seek an in-person evaluation if the symptoms worsen or if the condition fails to improve as anticipated.  [facility] [phone]  Other Instructions Please call your PCP office ASAP to schedule in person evaluation as you need exam, testing and possible referral to Gastroenterology.    If you have been instructed to have an in-person evaluation today at a local Urgent Care facility, please use the link below. It will take you to a list of all of our available [facility], including address, phone number and hours of operation. Please do not delay care.  [facility]  If you or a family member do not have a primary care provider, use the link below to schedule a visit and establish care. When you choose a [facility] primary care physician or advanced practice provider, you gain a long-term partner in health. Find a Primary Care Provider  Learn more about [facility] in-office and virtual care options: [facility] - Get Care Now

## 2024-06-09 ENCOUNTER — Telehealth: Payer: Self-pay | Admitting: Family Medicine

## 2024-06-09 NOTE — Telephone Encounter (Signed)
 I called pt to try & make an earlier appt.  Left message Copied from CRM [id number]. Topic: Appointments - Scheduling Inquiry for Clinic >> Jun 08, 2024 12:25 PM Graeme ORN wrote: Reason for CRM: Patient called. Was seen yesterday via virtual appt. States PCP is Dr Joyce - only provider he has seen since he was 17. Last appt not since 2017. Patient states he needs referral for endoscopy/coloscopy. Scheduled as new patient 1st available not until Sept with Dr Vita. Added to waitlist. Wanted to make someone aware of situation. If he can get in sooner please let him know thank You     ----------------------------------------------------------------------- From previous Reason for Contact - Scheduling: Patient/patient representative is calling to schedule an appointment. Refer to attachments for appointment information.

## 2024-07-15 ENCOUNTER — Ambulatory Visit: Payer: Self-pay | Admitting: Family Medicine

## 2024-07-15 ENCOUNTER — Ambulatory Visit
Admission: RE | Admit: 2024-07-15 | Discharge: 2024-07-15 | Disposition: A | Discharge status: Home / Self Care | Source: Ambulatory Visit | Attending: Family Medicine | Admitting: Family Medicine

## 2024-07-15 VITALS — BP 140/98 | HR 66 | Ht 72.0 in | Wt 246.4 lb

## 2024-07-15 DIAGNOSIS — K625 Hemorrhage of anus and rectum: Secondary | ICD-10-CM | POA: Diagnosis not present

## 2024-07-15 DIAGNOSIS — R109 Unspecified abdominal pain: Secondary | ICD-10-CM

## 2024-07-15 MED ORDER — POLYETHYLENE GLYCOL 3350 17 GM/SCOOP PO POWD: 17.0000 g | Freq: Two times a day (BID) | ORAL | 1 refills | Status: AC | PRN

## 2024-07-15 MED ORDER — OMEPRAZOLE 40 MG PO CPDR: 40.0000 mg | DELAYED_RELEASE_CAPSULE | Freq: Every day | ORAL | 3 refills | Status: AC

## 2024-07-15 NOTE — Progress Notes (Signed)
 Name: Adriane Greener   Date of Visit: 07/15/24   Date of last visit with me: Visit date not found   CHIEF COMPLAINT:  Chief Complaint  Patient presents with   Establish Care    Returning patient. Does not want flu vaccine.        HPI:  Discussed the use of AI scribe software for clinical note transcription with the patient, who gave verbal consent to proceed.  History of Present Illness   Thomos Domine is a 48 year old male who presents for follow-up of gastrointestinal symptoms and rectal bleeding.  He has a long-standing history of gastrointestinal issues since his teenage years. In 2017, he underwent an endoscopy and colonoscopy, which revealed H. pylori and a duodenal ulcer. He completed the H. pylori treatment a year later due to concerns about side effects impacting his work. He did not follow up after the initial treatment.  In recent months, he has experienced discomfort and pressure in the groin area, along with significant rectal bleeding. The bleeding is bright red and occurs randomly, not consistently with every bowel movement. It has not occurred in the past month but previously happened a few times a week. He also experiences bloating and increased gas, with bowel movements occurring twice daily, though he often feels the urge to defecate without significant results.  He has made dietary changes due to his symptoms and decreased physical activity over the past few years. In July, he switched to a diet rich in fish, berries, and salads, but reverted to his usual diet in August, including spaghetti and burgers. He experiences gastritis-like symptoms, such as an upset stomach, particularly after consuming oily foods.  He was previously prescribed Prilosec but did not fill the prescription. He has no significant alcohol consumption history and reports no urinary issues or symptoms suggestive of prostate problems. His lipid panel was noted to be high in the past.          OBJECTIVE:       07/15/2024   10:36 AM  Depression screen PHQ 2/9  Decreased Interest 0  Down, Depressed, Hopeless 0  PHQ - 2 Score 0     BP Readings from Last 3 Encounters:  07/15/24 (!) 140/98  10/15/19 (!) 128/97  10/15/19 123/87    BP (!) 140/98   Pulse 66   Ht 6' (1.829 m)   Wt 246 lb 6.4 oz (111.8 kg)   SpO2 98%   BMI 33.42 kg/m    Physical Exam   ABDOMEN: Abdomen non-tender on palpation.      Physical Exam Constitutional:      Appearance: Normal appearance.  Abdominal:     General: Abdomen is flat. Bowel sounds are normal. There is no distension.     Palpations: Abdomen is soft. There is no mass.     Tenderness: There is no abdominal tenderness. There is no guarding or rebound.     Hernia: No hernia is present.  Neurological:     Mental Status: He is alert.     ASSESSMENT/PLAN:   Assessment & Plan Stomach pain  Rectal bleeding    Assessment and Plan    Constipation with rectal bleeding Intermittent rectal bleeding likely due to constipation.  - Prescribed Miralax , one pack in the morning and one in the evening with water for two weeks. - Advised to drink 60-70 ounces of water daily. - Transition to Metamucil after two weeks. - Ordered abdominal x-ray. - Referred to gastroenterologist for further evaluation.  Abdominal pain and bloating Chronic abdominal pain and bloating possibly related to constipation and dietary habits. History of duodenal ulcer and H. pylori treatment. - Prescribed omeprazole , one pill daily for one month. - Advised dietary modifications to increase fiber intake. - Referred to gastroenterologist for further evaluation.  Gastroesophageal reflux disease (GERD) GERD symptoms exacerbated by dietary choices. Previous prescription for Prilosec was not filled. - Prescribed omeprazole , one pill daily for one month.  Prostate health maintenance Discussed prostate health screening. No current urinary or ejaculation  issues. - Advised to monitor for urinary or ejaculation symptoms. - Plan to start prostate screening at age 44 unless symptoms develop.         Lady Wisham A. Vita MD [facility]

## 2024-07-22 ENCOUNTER — Ambulatory Visit: Payer: Self-pay | Admitting: Family Medicine
